# Patient Record
Sex: Female | Born: 1985 | Race: Black or African American | Hispanic: No | Marital: Single | Smoking: Never smoker
Health system: Southern US, Community
[De-identification: ages and names within clinical notes are randomized; demographics above are authoritative.]

---

## 2011-11-19 ENCOUNTER — Other Ambulatory Visit: Payer: Self-pay

## 2011-11-19 ENCOUNTER — Emergency Department (HOSPITAL_COMMUNITY)
Admission: EM | Admit: 2011-11-19 | Discharge: 2011-11-19 | Disposition: A | Discharge status: Home / Self Care | Payer: Self-pay | Attending: Emergency Medicine | Admitting: Emergency Medicine

## 2011-11-19 ENCOUNTER — Encounter (HOSPITAL_COMMUNITY): Payer: Self-pay | Admitting: *Deleted

## 2011-11-19 DIAGNOSIS — M549 Dorsalgia, unspecified: Secondary | ICD-10-CM | POA: Insufficient documentation

## 2011-11-19 DIAGNOSIS — E669 Obesity, unspecified: Secondary | ICD-10-CM | POA: Insufficient documentation

## 2011-11-19 DIAGNOSIS — R109 Unspecified abdominal pain: Secondary | ICD-10-CM | POA: Insufficient documentation

## 2011-11-19 DIAGNOSIS — R071 Chest pain on breathing: Secondary | ICD-10-CM | POA: Insufficient documentation

## 2011-11-19 LAB — URINALYSIS, ROUTINE W REFLEX MICROSCOPIC
Bilirubin Urine: NEGATIVE
Hgb urine dipstick: NEGATIVE
Ketones, ur: >80 mg/dL — AB
Protein, ur: NEGATIVE mg/dL
Urobilinogen, UA: 1 mg/dL (ref 0.0–1.0)

## 2011-11-19 MED ORDER — IBUPROFEN 400 MG PO TABS: 400.0000 mg | ORAL_TABLET | Freq: Three times a day (TID) | ORAL | Status: AC

## 2011-11-19 MED ORDER — RANITIDINE HCL 150 MG PO CAPS: 150.0000 mg | ORAL_CAPSULE | Freq: Every day | ORAL | Status: DC

## 2011-11-19 NOTE — ED Provider Notes (Signed)
History     CSN: [id number]  Arrival date & time 11/19/11  1541   First MD Initiated Contact with Patient 11/19/11 1619      Chief Complaint  Patient presents with  . Abdominal Pain    (Consider location/radiation/quality/duration/timing/severity/associated sxs/prior treatment) Patient is a 26 y.o. female presenting with abdominal pain and chest pain. The history is provided by the patient.  Abdominal Pain The primary symptoms of the illness include abdominal pain. The primary symptoms of the illness do not include fever, shortness of breath, nausea, vomiting, diarrhea or dysuria. Episode onset: 1.5 weeks ago. The onset of the illness was gradual. The problem has not changed since onset. Onset: 1.5 weeks ago. The pain came on gradually. The abdominal pain has been unchanged (intermittent) since its onset. The abdominal pain is located in the epigastric region and periumbilical region. The abdominal pain does not radiate. Pain scale: mild. The abdominal pain is relieved by nothing. Exacerbated by: walked several miles today which worsened pain.  Associated with: no known associations. The patient states that she believes she is currently not pregnant. The patient has not had a change in bowel habit. Additional symptoms associated with the illness include back pain. Associated symptoms comments: Chest pain. Significant associated medical issues do not include diabetes, substance abuse or cardiac disease.  Chest Pain Episode onset: 1.5 weeks ago. Chest pain occurs intermittently. The chest pain is unchanged. Associated with: pt unable of any associations. The severity of the pain is mild. The quality of the pain is described as sharp. The pain does not radiate. Exacerbated by: walked several miles today, which worsened.   Primary symptoms include abdominal pain. Pertinent negatives for primary symptoms include no fever, no shortness of breath, no cough, no nausea and no vomiting. She tried nothing  for the symptoms. Risk factors include obesity.  Pertinent negatives for past medical history include no diabetes, no DVT, no hyperlipidemia, no hypertension and no PE.  Pertinent negatives for family medical history include: no CAD in family, no early MI in family and no PE in family.     History reviewed. No pertinent past medical history.  History reviewed. No pertinent past surgical history.  History reviewed. No pertinent family history.  History  Substance Use Topics  . Smoking status: Never Smoker   . Smokeless tobacco: Not on file  . Alcohol Use: No    OB History    Grav Para Term Preterm Abortions TAB SAB Ect Mult Living                  Review of Systems  Constitutional: Negative for fever.  HENT: Negative for congestion.   Respiratory: Negative for cough and shortness of breath.   Cardiovascular: Positive for chest pain.  Gastrointestinal: Positive for abdominal pain. Negative for nausea, vomiting and diarrhea.  Genitourinary: Negative for dysuria and difficulty urinating.  Musculoskeletal: Positive for back pain.  All other systems reviewed and are negative.    Allergies  Review of patient's allergies indicates no known allergies.  Home Medications  No current outpatient prescriptions on file.  BP 127/86  Pulse 94  Temp(Src) 97.9 F (36.6 C) (Oral)  Resp 20  SpO2 100%  Physical Exam  Nursing note and vitals reviewed. Constitutional: She is oriented to person, place, and time. She appears well-developed and well-nourished. No distress.       Obese  HENT:  Head: Normocephalic and atraumatic.  Mouth/Throat: Oropharynx is clear and moist.  Eyes: Conjunctivae are normal.  No scleral icterus.  Neck: Neck supple.  Cardiovascular: Normal rate, regular rhythm, normal heart sounds and intact distal pulses.   No murmur heard. Pulmonary/Chest: Effort normal and breath sounds normal. No stridor. No respiratory distress. She has no rales.       Chest wall  tenderness to palpation  Abdominal: Soft. Bowel sounds are normal. She exhibits no distension. There is no tenderness. There is no rebound and no guarding.  Musculoskeletal: Normal range of motion.  Neurological: She is alert and oriented to person, place, and time. Gait normal.  Skin: Skin is warm and dry. No rash noted.  Psychiatric: She has a normal mood and affect. Her behavior is normal.    ED Course  Procedures (including critical care time)  Labs Reviewed  URINALYSIS, ROUTINE W REFLEX MICROSCOPIC - Abnormal; Notable for the following:    Ketones, ur >80 (*)    All other components within normal limits  PREGNANCY, URINE   No results found.   Date: 11/19/2011  Rate: 91  Rhythm: normal sinus rhythm  QRS Axis: normal  Intervals: normal  ST/T Wave abnormalities: normal  Conduction Disutrbances:none  Narrative Interpretation:   Old EKG Reviewed: none available     1. Chest pain   2. Abdominal pain       MDM  26 year old female presenting with nonspecific chest and abdominal pain for the past week and a half. she states her symptoms come and go, and do not appear associated with anything in particular. She denies shortness of breath, nausea, or diaphoresis. She also denies changes to her bowel movements or vomiting. On initial exam she was well appearing, with normal vital signs, and in no apparent distress. She denied current abdominal pain and had no abdominal tenderness. She did complain of abdominal chest wall tenderness to palpation. She has no cardiac and is PERC negative. Her EKG was unremarkable. Her urinalysis and urine pregnancy test are pending.  Based on history and exam, do not think that she needs labwork or imaging.    UA and U preg are negative.  Pt remains well appearing.  Will discharge home.         Warnell Forester, MD 11/19/11 Ernestina [place]

## 2011-11-19 NOTE — ED Notes (Signed)
The pt has has chest abd and back pain for 1-2 weeks.  No nv or diarrhea.  lmp none  implant

## 2011-11-19 NOTE — ED Notes (Signed)
Pt c/o diffuse abd pain, back, and chest pain x1.5 wks. Pain is intermittent. Pt has implanon and it expired last mo and pt is worried that it could be cause of pain. Pt states she feels as if she is lightheaded and cant walk around. Denies n/v/d. Pt states pain is worst in epigastric region. Last BM was last night.

## 2011-11-19 NOTE — ED Notes (Signed)
MD at bedside. 

## 2011-11-19 NOTE — ED Provider Notes (Signed)
I saw and evaluated the patient, reviewed the resident's note and I agree with the findings and plan,\ekg interpretation (reviewed by me)  Reproducible chest pain. Low risk ACS, PERC negative. No suspicion infectious/pna/pericarditis/myocarditis/dissection. Abd benign. Well appearing. VSS. EKG unremarkable. U/A not consistent with infection. Comfortable with discharge home, plan for ibuprofen/pepcid. Given strict precautions for return to ED for both abd pain/cp.  Forbes Cellar, MD 11/19/11 2101

## 2011-11-19 NOTE — ED Notes (Signed)
Pt d/c home in NAD. Pt with boyfriend. Pt ambulated with quick, steady gait.

## 2012-06-11 ENCOUNTER — Encounter (HOSPITAL_COMMUNITY): Payer: Self-pay | Admitting: Emergency Medicine

## 2012-06-11 ENCOUNTER — Emergency Department (HOSPITAL_COMMUNITY): Payer: Self-pay

## 2012-06-11 ENCOUNTER — Other Ambulatory Visit: Payer: Self-pay

## 2012-06-11 ENCOUNTER — Emergency Department (HOSPITAL_COMMUNITY)
Admission: EM | Admit: 2012-06-11 | Discharge: 2012-06-12 | Disposition: A | Discharge status: Home / Self Care | Payer: Self-pay | Attending: Emergency Medicine | Admitting: Emergency Medicine

## 2012-06-11 DIAGNOSIS — R079 Chest pain, unspecified: Secondary | ICD-10-CM | POA: Insufficient documentation

## 2012-06-11 DIAGNOSIS — R110 Nausea: Secondary | ICD-10-CM | POA: Insufficient documentation

## 2012-06-11 DIAGNOSIS — R0789 Other chest pain: Secondary | ICD-10-CM

## 2012-06-11 LAB — CBC WITH DIFFERENTIAL/PLATELET
Basophils Absolute: 0 10*3/uL (ref 0.0–0.1)
Basophils Relative: 0 % (ref 0–1)
Eosinophils Relative: 1 % (ref 0–5)
HCT: 40.6 % (ref 36.0–46.0)
Hemoglobin: 13.6 g/dL (ref 12.0–15.0)
MCH: 24.2 pg — ABNORMAL LOW (ref 26.0–34.0)
MCHC: 33.5 g/dL (ref 30.0–36.0)
MCV: 72.1 fL — ABNORMAL LOW (ref 78.0–100.0)
Monocytes Absolute: 0.6 10*3/uL (ref 0.1–1.0)
Monocytes Relative: 6 % (ref 3–12)
RDW: 14 % (ref 11.5–15.5)

## 2012-06-11 LAB — POCT I-STAT, CHEM 8
BUN: 10 mg/dL (ref 6–23)
Calcium, Ion: 1.17 mmol/L (ref 1.12–1.23)
Glucose, Bld: 102 mg/dL — ABNORMAL HIGH (ref 70–99)
HCT: 46 % (ref 36.0–46.0)
TCO2: 23 mmol/L (ref 0–100)

## 2012-06-11 LAB — POCT I-STAT TROPONIN I: Troponin i, poc: 0 ng/mL (ref 0.00–0.08)

## 2012-06-11 NOTE — ED Notes (Addendum)
Pt c/o intermittent sharp throbbing bilateral CP that radiates to the back since Yesterday. Symptoms off and on for several months. Denies diaphoresis or vomiting. States she has felt nausea.

## 2012-06-11 NOTE — ED Notes (Signed)
Patient with chest pain radiating to her back.  Patient states she did have some nausea this am and yesterday.  No shortness of breath at this time.  Patient states that the pain is sharp in nature.

## 2012-06-12 MED ORDER — OMEPRAZOLE 20 MG PO CPDR: 20.0000 mg | DELAYED_RELEASE_CAPSULE | Freq: Every day | ORAL | Status: AC

## 2012-06-12 NOTE — ED Notes (Signed)
Pt states understanding of discharge instructions 

## 2012-06-12 NOTE — ED Provider Notes (Addendum)
Pt came in with cc of chest pain, radiating to the back. Pain is mostly in the mid sternal region, sharp, started yday and has no specific aggravating or relieving factors. Pt has associated nausea, no emesis. No significant medical hx, no associated sob, neurologic complains, no syncope. Denies illicit use, premature CAD in the family, not pregnant.  PMHX: None  Phys exam: aox3 no distress. ENT - supple neck Cardiac: Regular rate and rhythm, no murmur Lungs: Clear to auscultation bilaterally. Abd: + bowel sounds Lower extremity: no unilateral swelling, tenderness   Family hx: No premature CAD  Social Hx:  No illicit substance use/  Meds: No prescribed meds.  A/P Pt comes in with cc of chest pain radiating to the back. No risk factors for dissection. Will get basic cardiac labs and a screening CXR and EKG.   Late Entry: CXR is negative. Pt wanted to go home while i was busy with other acute patients, and was discharged/ She was pain free.  GI cocktail ordered, as suspicion was high for non cardiac pathology.  Derwood Kaplan, MD 08/23/12 1610  Derwood Kaplan, MD 09/16/12 [id number]

## 2012-06-12 NOTE — ED Provider Notes (Signed)
Patient reports being completely pain free without intervention. All labs normal, CXR negative. Will discharge home and recommended Prilosec +/- Pepcid.  Rodena Medin, PA-C 06/12/12 0021

## 2013-02-03 IMAGING — CR DG CHEST 2V
2 series · 2 of 2 positions shown · non-contrast
Comparison: None.

CLINICAL DATA: Chest pain.  Tired feeling.  Shortness of breath.

CHEST - 2 VIEW

[w chest pa]
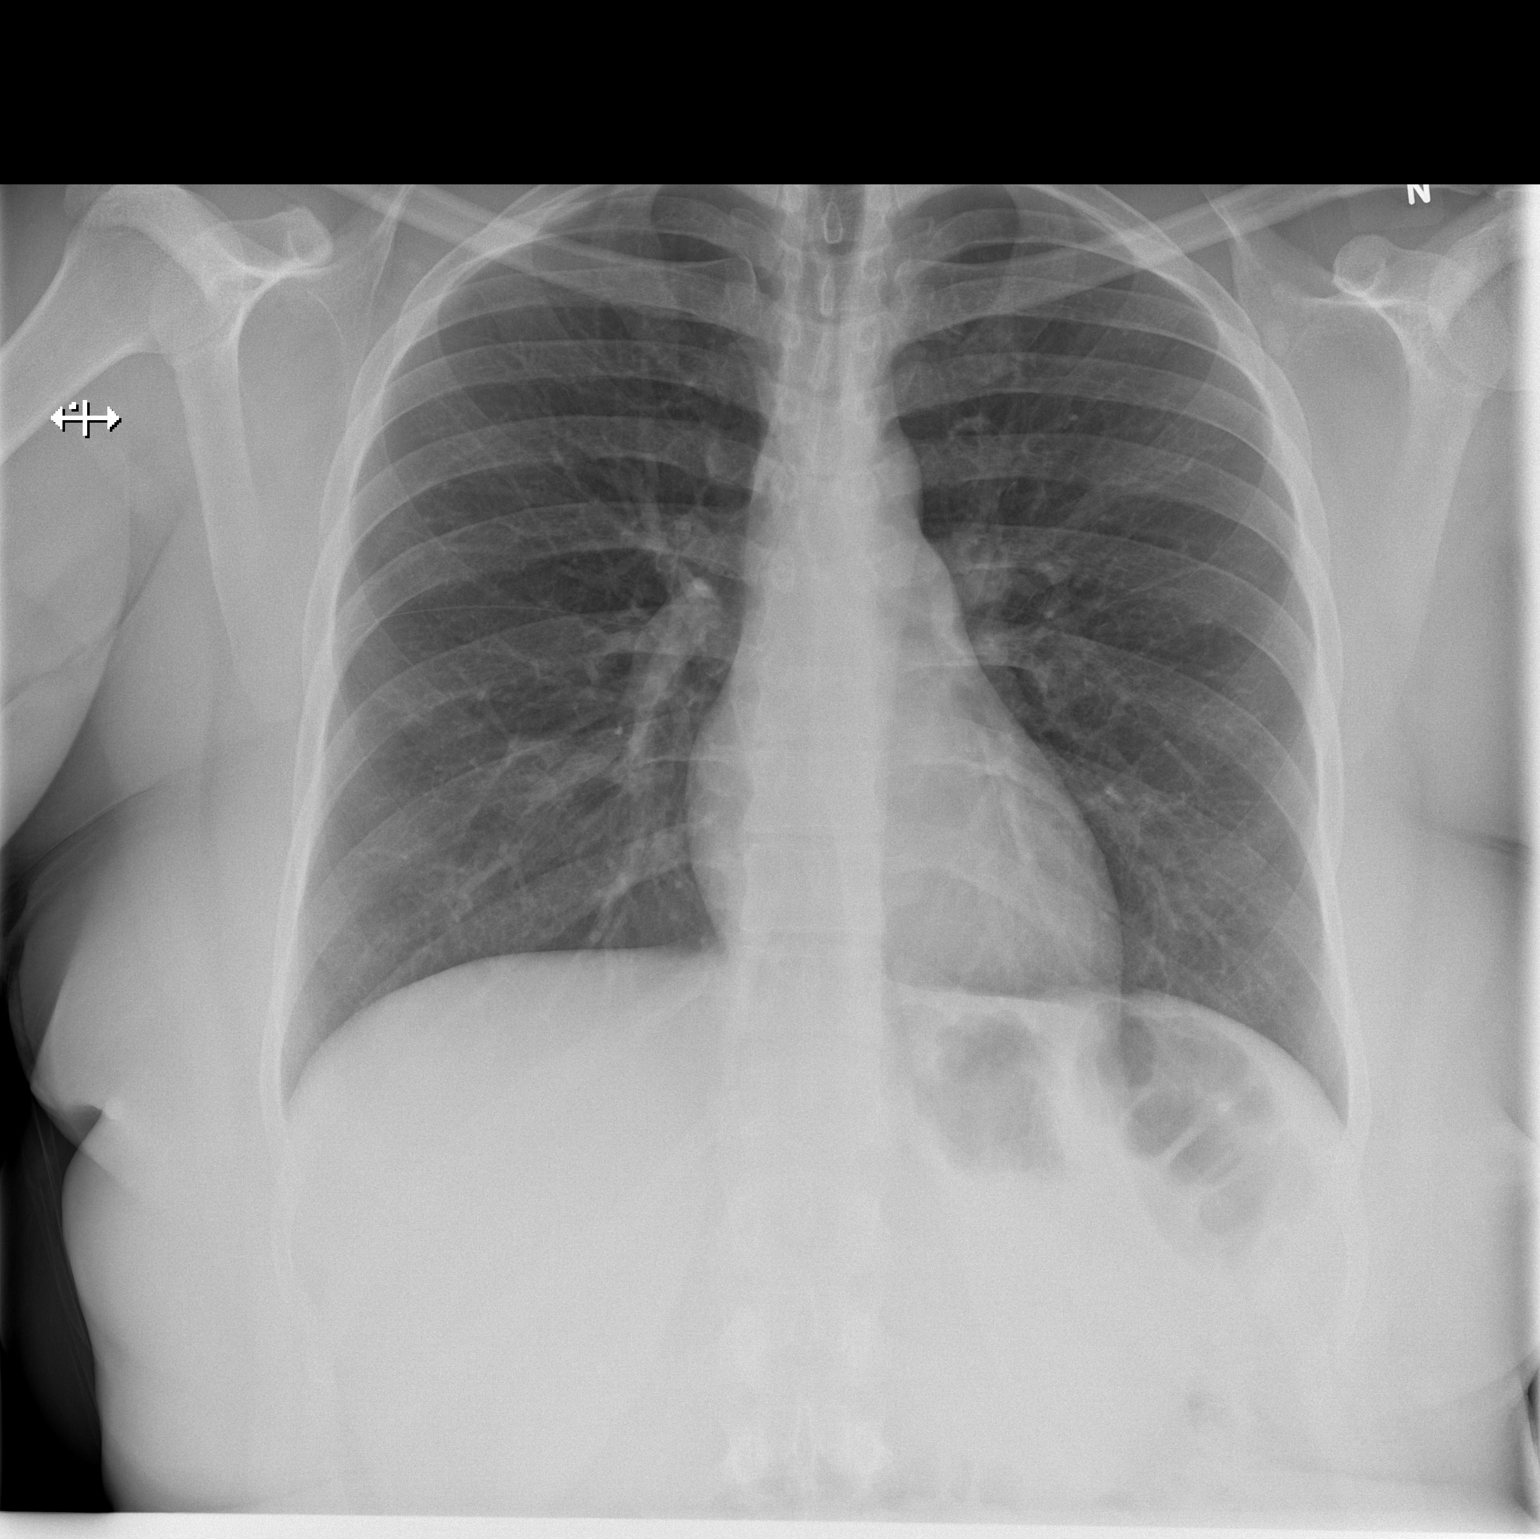

[w chest lat]
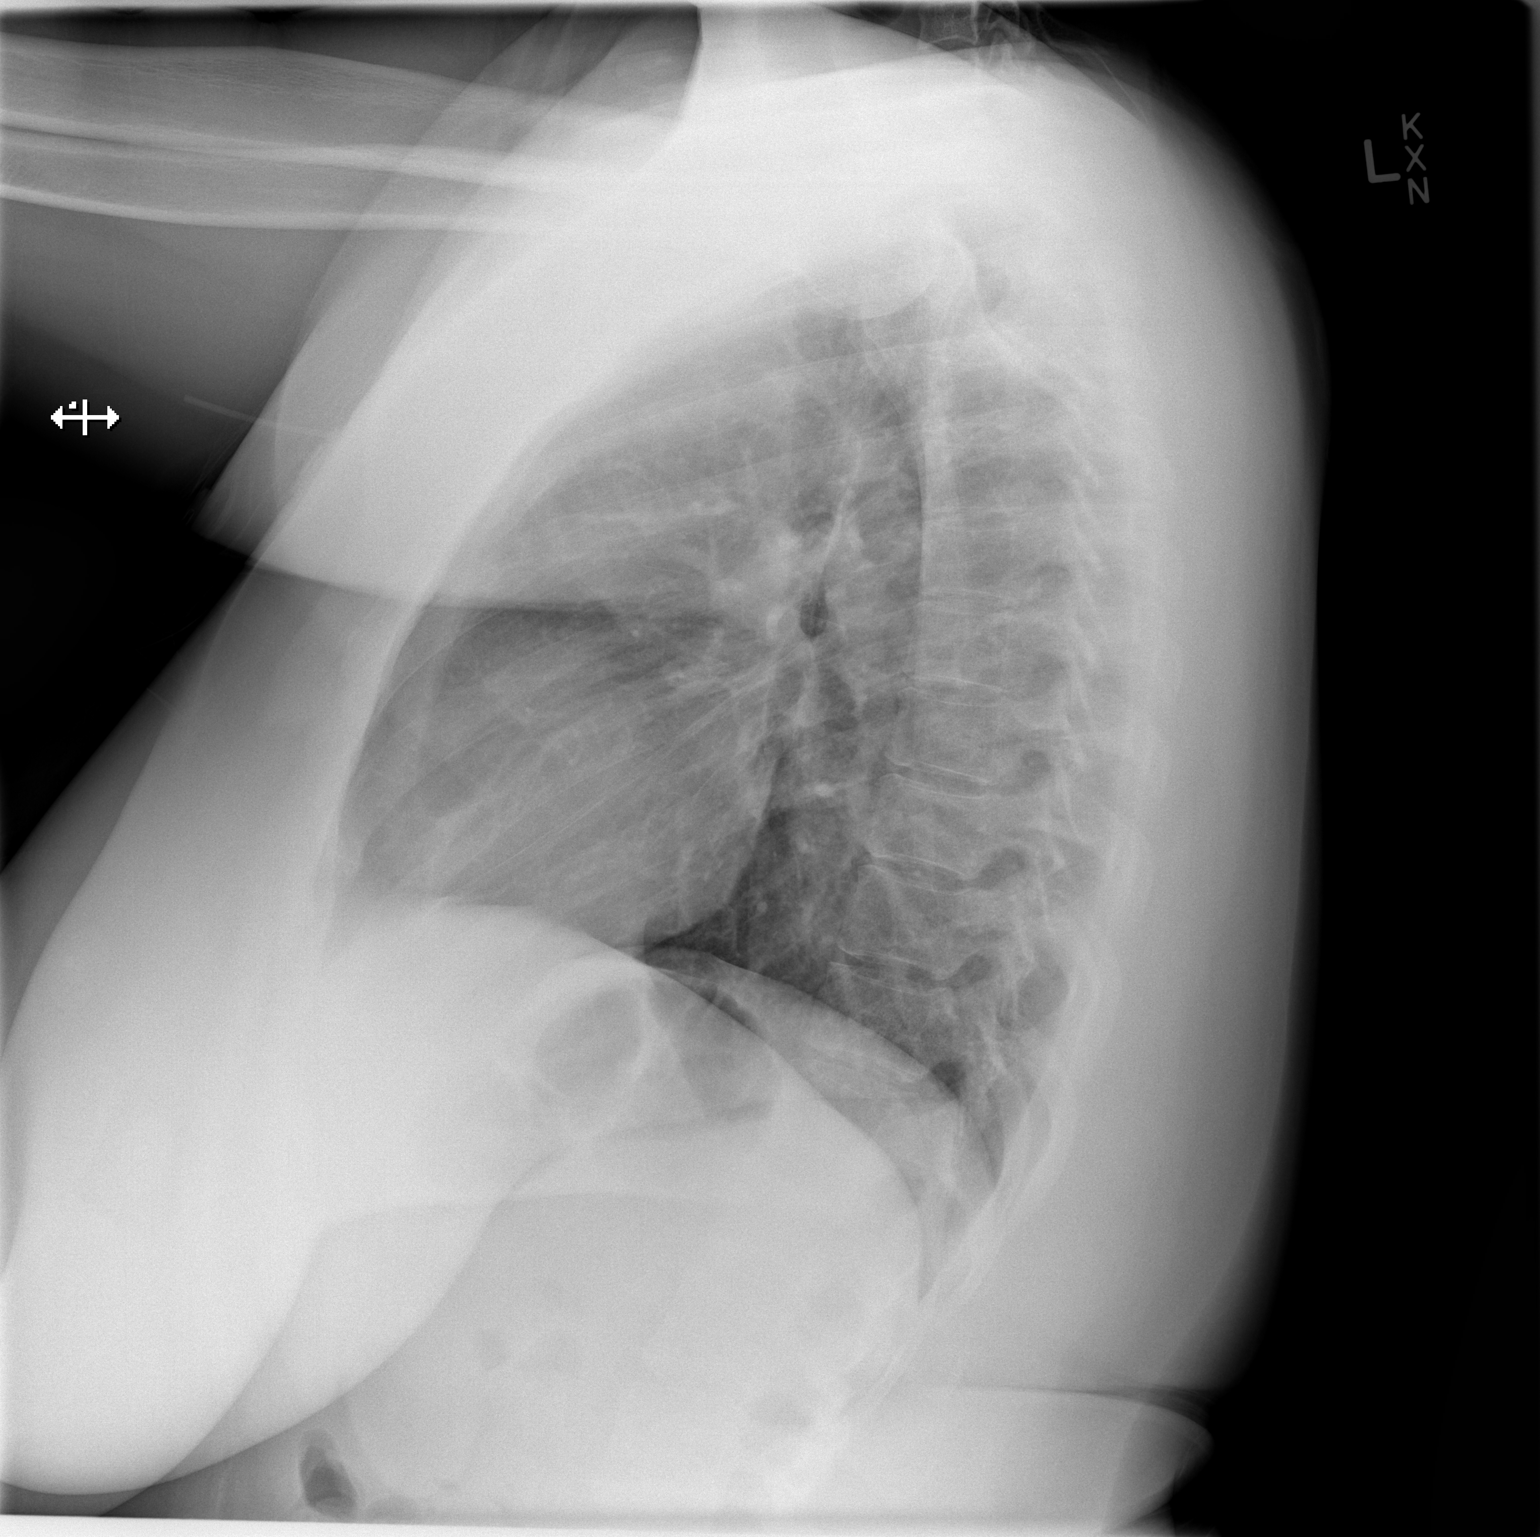

[2 of 2 positions shown; findings below may reference images not displayed]

FINDINGS: The heart size and pulmonary vascularity are normal. The
lungs appear clear and expanded without focal air space disease or
consolidation. No blunting of the costophrenic angles.  No
pneumothorax.  Mediastinal contours appear intact.
IMPRESSION: No evidence of active pulmonary disease.

## 2014-03-13 ENCOUNTER — Encounter (HOSPITAL_COMMUNITY): Payer: Self-pay | Admitting: Emergency Medicine

## 2014-03-13 ENCOUNTER — Emergency Department (HOSPITAL_COMMUNITY)
Admission: EM | Admit: 2014-03-13 | Discharge: 2014-03-14 | Disposition: A | Discharge status: Home / Self Care | Payer: No Typology Code available for payment source | Attending: Emergency Medicine | Admitting: Emergency Medicine

## 2014-03-13 DIAGNOSIS — S339XXA Sprain of unspecified parts of lumbar spine and pelvis, initial encounter: Secondary | ICD-10-CM | POA: Insufficient documentation

## 2014-03-13 DIAGNOSIS — S298XXA Other specified injuries of thorax, initial encounter: Secondary | ICD-10-CM | POA: Insufficient documentation

## 2014-03-13 DIAGNOSIS — Y9389 Activity, other specified: Secondary | ICD-10-CM | POA: Insufficient documentation

## 2014-03-13 DIAGNOSIS — S39012A Strain of muscle, fascia and tendon of lower back, initial encounter: Secondary | ICD-10-CM

## 2014-03-13 DIAGNOSIS — S335XXA Sprain of ligaments of lumbar spine, initial encounter: Principal | ICD-10-CM

## 2014-03-13 DIAGNOSIS — Z79899 Other long term (current) drug therapy: Secondary | ICD-10-CM | POA: Insufficient documentation

## 2014-03-13 DIAGNOSIS — S46909A Unspecified injury of unspecified muscle, fascia and tendon at shoulder and upper arm level, unspecified arm, initial encounter: Secondary | ICD-10-CM | POA: Insufficient documentation

## 2014-03-13 DIAGNOSIS — Y92410 Unspecified street and highway as the place of occurrence of the external cause: Secondary | ICD-10-CM | POA: Insufficient documentation

## 2014-03-13 DIAGNOSIS — S0990XA Unspecified injury of head, initial encounter: Secondary | ICD-10-CM | POA: Insufficient documentation

## 2014-03-13 DIAGNOSIS — S4980XA Other specified injuries of shoulder and upper arm, unspecified arm, initial encounter: Secondary | ICD-10-CM | POA: Insufficient documentation

## 2014-03-13 MED ORDER — IBUPROFEN 400 MG PO TABS
800.0000 mg | ORAL_TABLET | Freq: Once | ORAL | Status: AC
  Administered 2014-03-13: 800 mg via ORAL
  Filled 2014-03-13: qty 2
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
## 2014-03-14 MED ORDER — NAPROXEN 500 MG PO TABS: 500.0000 mg | ORAL_TABLET | Freq: Two times a day (BID) | ORAL | Status: AC

## 2014-03-14 MED ORDER — HYDROCODONE-ACETAMINOPHEN 5-325 MG PO TABS: 1.0000 | ORAL_TABLET | Freq: Four times a day (QID) | ORAL | Status: AC | PRN
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
## 2014-03-14 NOTE — ED Notes (Signed)
Pt discharged home with all belongings, pt alert, oriented, and ambulatory upon discharge, 2 new rx prescribed, pt verbalizes understanding of discharge instructions, pt driven home by family

## 2014-03-16 NOTE — ED Provider Notes (Signed)
Medical screening examination/treatment/procedure(s) were performed by non-physician practitioner and as supervising physician I was immediately available for consultation/collaboration.   EKG Interpretation None        Deborra Phegley, MD 03/16/14 1001 

## 2014-03-25 ENCOUNTER — Encounter (HOSPITAL_COMMUNITY): Payer: Self-pay | Admitting: Emergency Medicine

## 2014-03-25 ENCOUNTER — Emergency Department (HOSPITAL_COMMUNITY)
Admission: EM | Admit: 2014-03-25 | Discharge: 2014-03-25 | Disposition: A | Discharge status: Home / Self Care | Payer: No Typology Code available for payment source | Attending: Emergency Medicine | Admitting: Emergency Medicine

## 2014-03-25 DIAGNOSIS — R51 Headache: Secondary | ICD-10-CM | POA: Insufficient documentation

## 2014-03-25 DIAGNOSIS — G8911 Acute pain due to trauma: Secondary | ICD-10-CM | POA: Insufficient documentation

## 2014-03-25 DIAGNOSIS — Z041 Encounter for examination and observation following transport accident: Secondary | ICD-10-CM

## 2014-03-25 DIAGNOSIS — M791 Myalgia, unspecified site: Secondary | ICD-10-CM

## 2014-03-25 DIAGNOSIS — Z79899 Other long term (current) drug therapy: Secondary | ICD-10-CM | POA: Insufficient documentation

## 2014-03-25 DIAGNOSIS — Z760 Encounter for issue of repeat prescription: Secondary | ICD-10-CM | POA: Insufficient documentation

## 2014-03-25 DIAGNOSIS — Z791 Long term (current) use of non-steroidal anti-inflammatories (NSAID): Secondary | ICD-10-CM | POA: Insufficient documentation

## 2014-03-25 DIAGNOSIS — R52 Pain, unspecified: Secondary | ICD-10-CM | POA: Insufficient documentation

## 2014-03-25 DIAGNOSIS — Z043 Encounter for examination and observation following other accident: Secondary | ICD-10-CM

## 2014-03-25 DIAGNOSIS — R519 Headache, unspecified: Secondary | ICD-10-CM

## 2014-03-25 MED ORDER — TRAMADOL HCL 50 MG PO TABS: 50.0000 mg | ORAL_TABLET | Freq: Four times a day (QID) | ORAL | Status: AC | PRN

## 2014-03-25 MED ORDER — METHOCARBAMOL 750 MG PO TABS: 750.0000 mg | ORAL_TABLET | Freq: Four times a day (QID) | ORAL | Status: AC
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
## 2014-03-25 NOTE — ED Notes (Signed)
Pt discharged home with all belongings, pt alert, oriented and ambulatory upon discharge, 2 new RX prescribed, pt encouraged to f/u with orthopedic dr, pt verbalizes understanding of discharge instructions, pt drove self home, no narcotics given in ED
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
